# Patient Record
Sex: Male | Born: 1945 | Race: White | Hispanic: No | Marital: Married | Smoking: Never smoker
Health system: Southern US, Community
[De-identification: ages and names within clinical notes are randomized; demographics above are authoritative.]

## PROBLEM LIST (undated history)

## (undated) DIAGNOSIS — J302 Other seasonal allergic rhinitis: Secondary | ICD-10-CM

## (undated) DIAGNOSIS — H25019 Cortical age-related cataract, unspecified eye: Secondary | ICD-10-CM

## (undated) DIAGNOSIS — M1990 Unspecified osteoarthritis, unspecified site: Secondary | ICD-10-CM

## (undated) DIAGNOSIS — K649 Unspecified hemorrhoids: Secondary | ICD-10-CM

## (undated) DIAGNOSIS — R002 Palpitations: Secondary | ICD-10-CM

## (undated) DIAGNOSIS — F419 Anxiety disorder, unspecified: Secondary | ICD-10-CM

## (undated) HISTORY — PX: GUM SURGERY: SHX658

## (undated) HISTORY — PX: NECK SURGERY: SHX720

## (undated) HISTORY — PX: CATARACT EXTRACTION: SUR2

## (undated) HISTORY — PX: EYE SURGERY: SHX253

## (undated) HISTORY — PX: COLONOSCOPY: SHX174

---

## 2004-09-24 ENCOUNTER — Encounter: Payer: Self-pay | Admitting: Psychiatry

## 2004-09-28 ENCOUNTER — Encounter: Payer: Self-pay | Admitting: Psychiatry

## 2005-09-15 ENCOUNTER — Inpatient Hospital Stay: Payer: Self-pay | Admitting: Unknown Physician Specialty

## 2009-04-23 ENCOUNTER — Ambulatory Visit: Payer: Self-pay | Admitting: Ophthalmology

## 2010-07-17 ENCOUNTER — Ambulatory Visit: Payer: Self-pay | Admitting: Ophthalmology

## 2010-07-29 ENCOUNTER — Ambulatory Visit: Payer: Self-pay | Admitting: Ophthalmology

## 2012-06-07 ENCOUNTER — Ambulatory Visit: Payer: Self-pay | Admitting: Gastroenterology

## 2012-07-25 ENCOUNTER — Emergency Department: Payer: Self-pay | Admitting: Unknown Physician Specialty

## 2012-07-25 LAB — BASIC METABOLIC PANEL
Chloride: 105 mmol/L (ref 98–107)
Creatinine: 0.96 mg/dL (ref 0.60–1.30)
EGFR (Non-African Amer.): >60
Glucose: 105 mg/dL — ABNORMAL HIGH (ref 65–99)
Osmolality: 276 (ref 275–301)

## 2012-07-25 LAB — CBC
HGB: 13.9 g/dL (ref 13.0–18.0)
MCH: 32.8 pg (ref 26.0–34.0)
Platelet: 208 10*3/uL (ref 150–440)
RBC: 4.24 10*6/uL — ABNORMAL LOW (ref 4.40–5.90)
RDW: 12.8 % (ref 11.5–14.5)

## 2012-07-25 LAB — CK TOTAL AND CKMB (NOT AT ARMC): CK, Total: 341 U/L — ABNORMAL HIGH (ref 35–232)

## 2012-07-25 LAB — HEPATIC FUNCTION PANEL A (ARMC)
Alkaline Phosphatase: 93 U/L (ref 50–136)
Bilirubin, Direct: 0.1 mg/dL (ref 0.00–0.20)
Bilirubin,Total: 0.4 mg/dL (ref 0.2–1.0)
SGOT(AST): 45 U/L — ABNORMAL HIGH (ref 15–37)
SGPT (ALT): 33 U/L (ref 12–78)

## 2012-07-25 LAB — TROPONIN I
Troponin-I: <0.02 ng/mL
Troponin-I: <0.02 ng/mL

## 2014-06-04 IMAGING — CR DG CHEST 2V
1 series · 2 of 2 positions shown · non-contrast
Comparison: none

REASON FOR EXAM: cp
COMMENTS:   May transport without cardiac monitor

PROCEDURE:     DXR - DXR CHEST PA (OR AP) AND LATERAL  - July 25, 2012  [DATE]
RESULT:     The lungs are clear. The cardiac silhouette and visualized bony
skeleton are unremarkable.

[Series 1: w chest pa · 0.14mm/px · 2 of 2 slices shown]
[im 1/2]
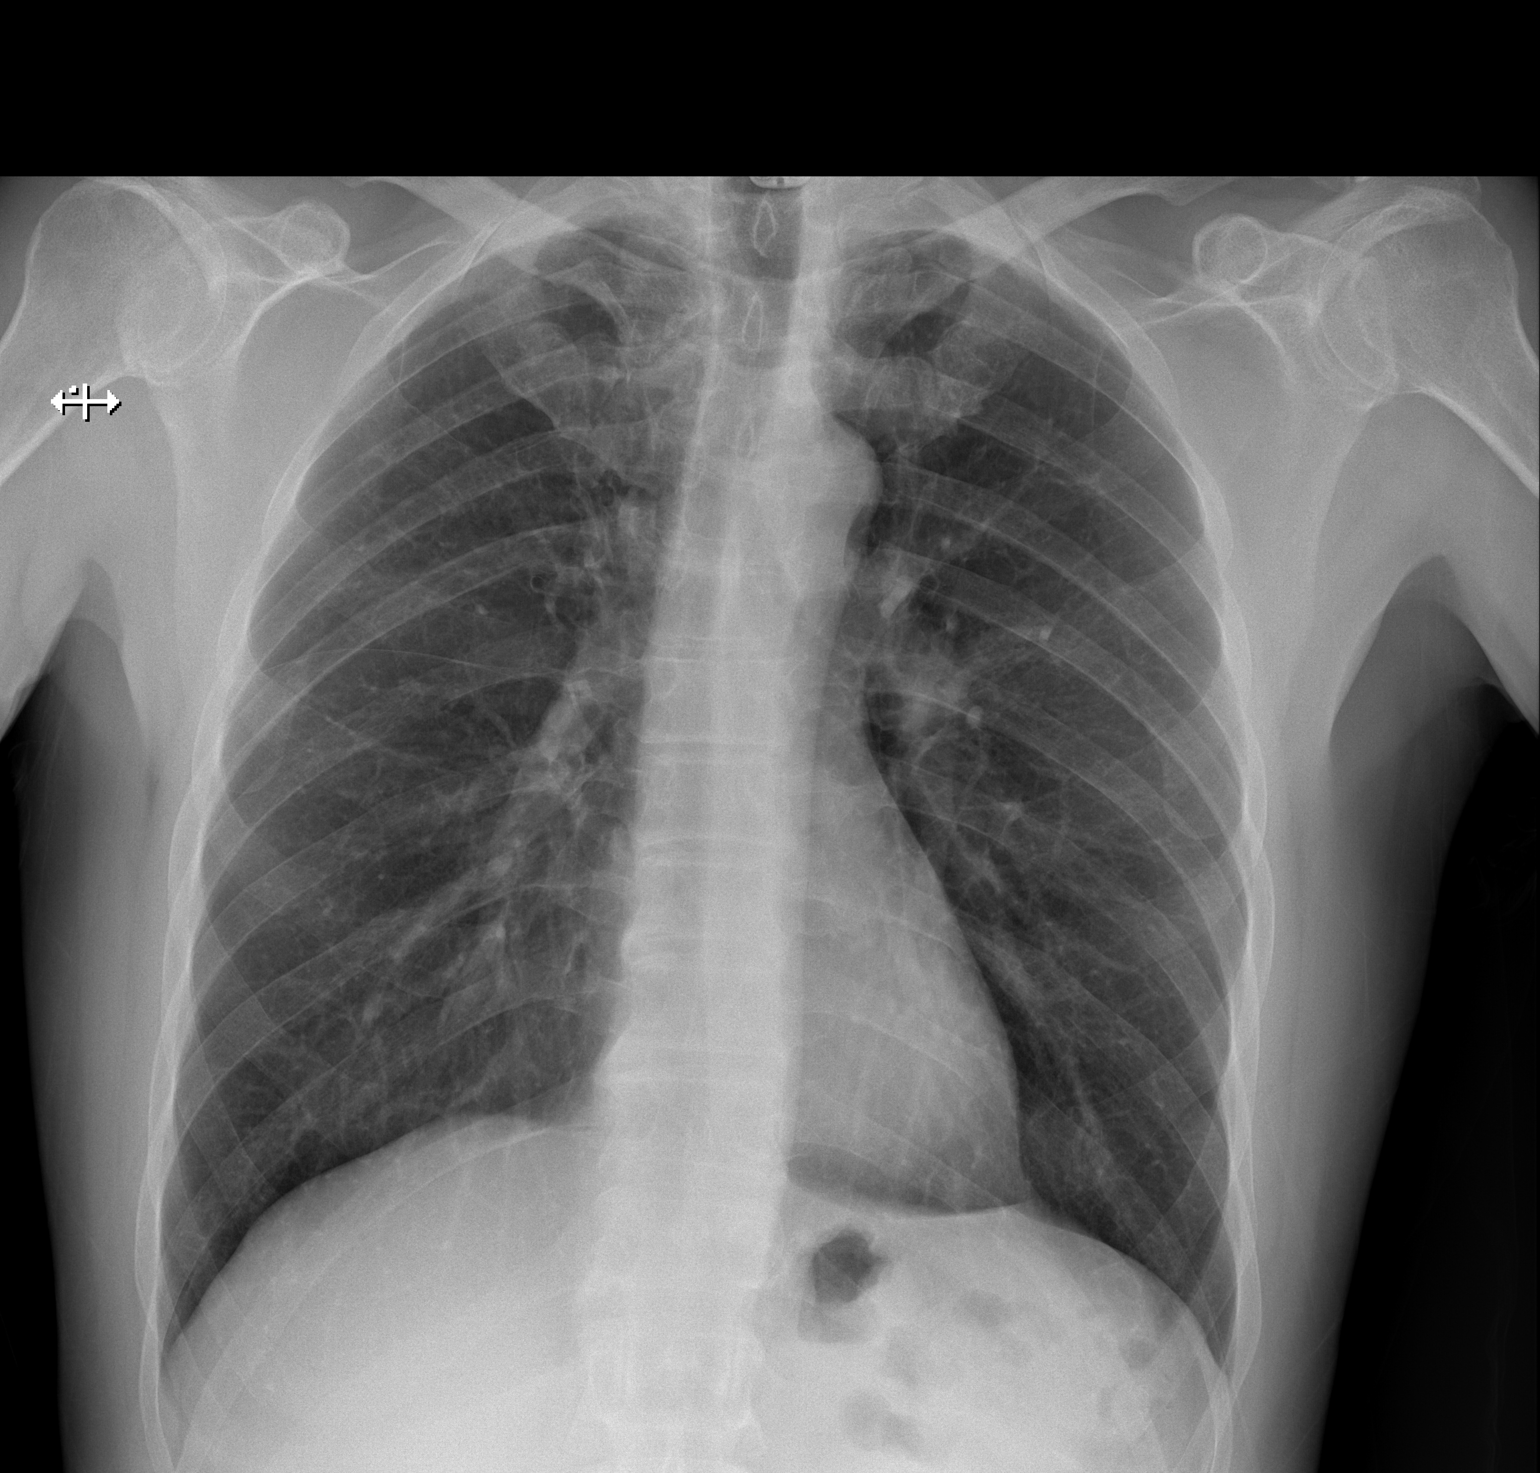
[im 2/2]
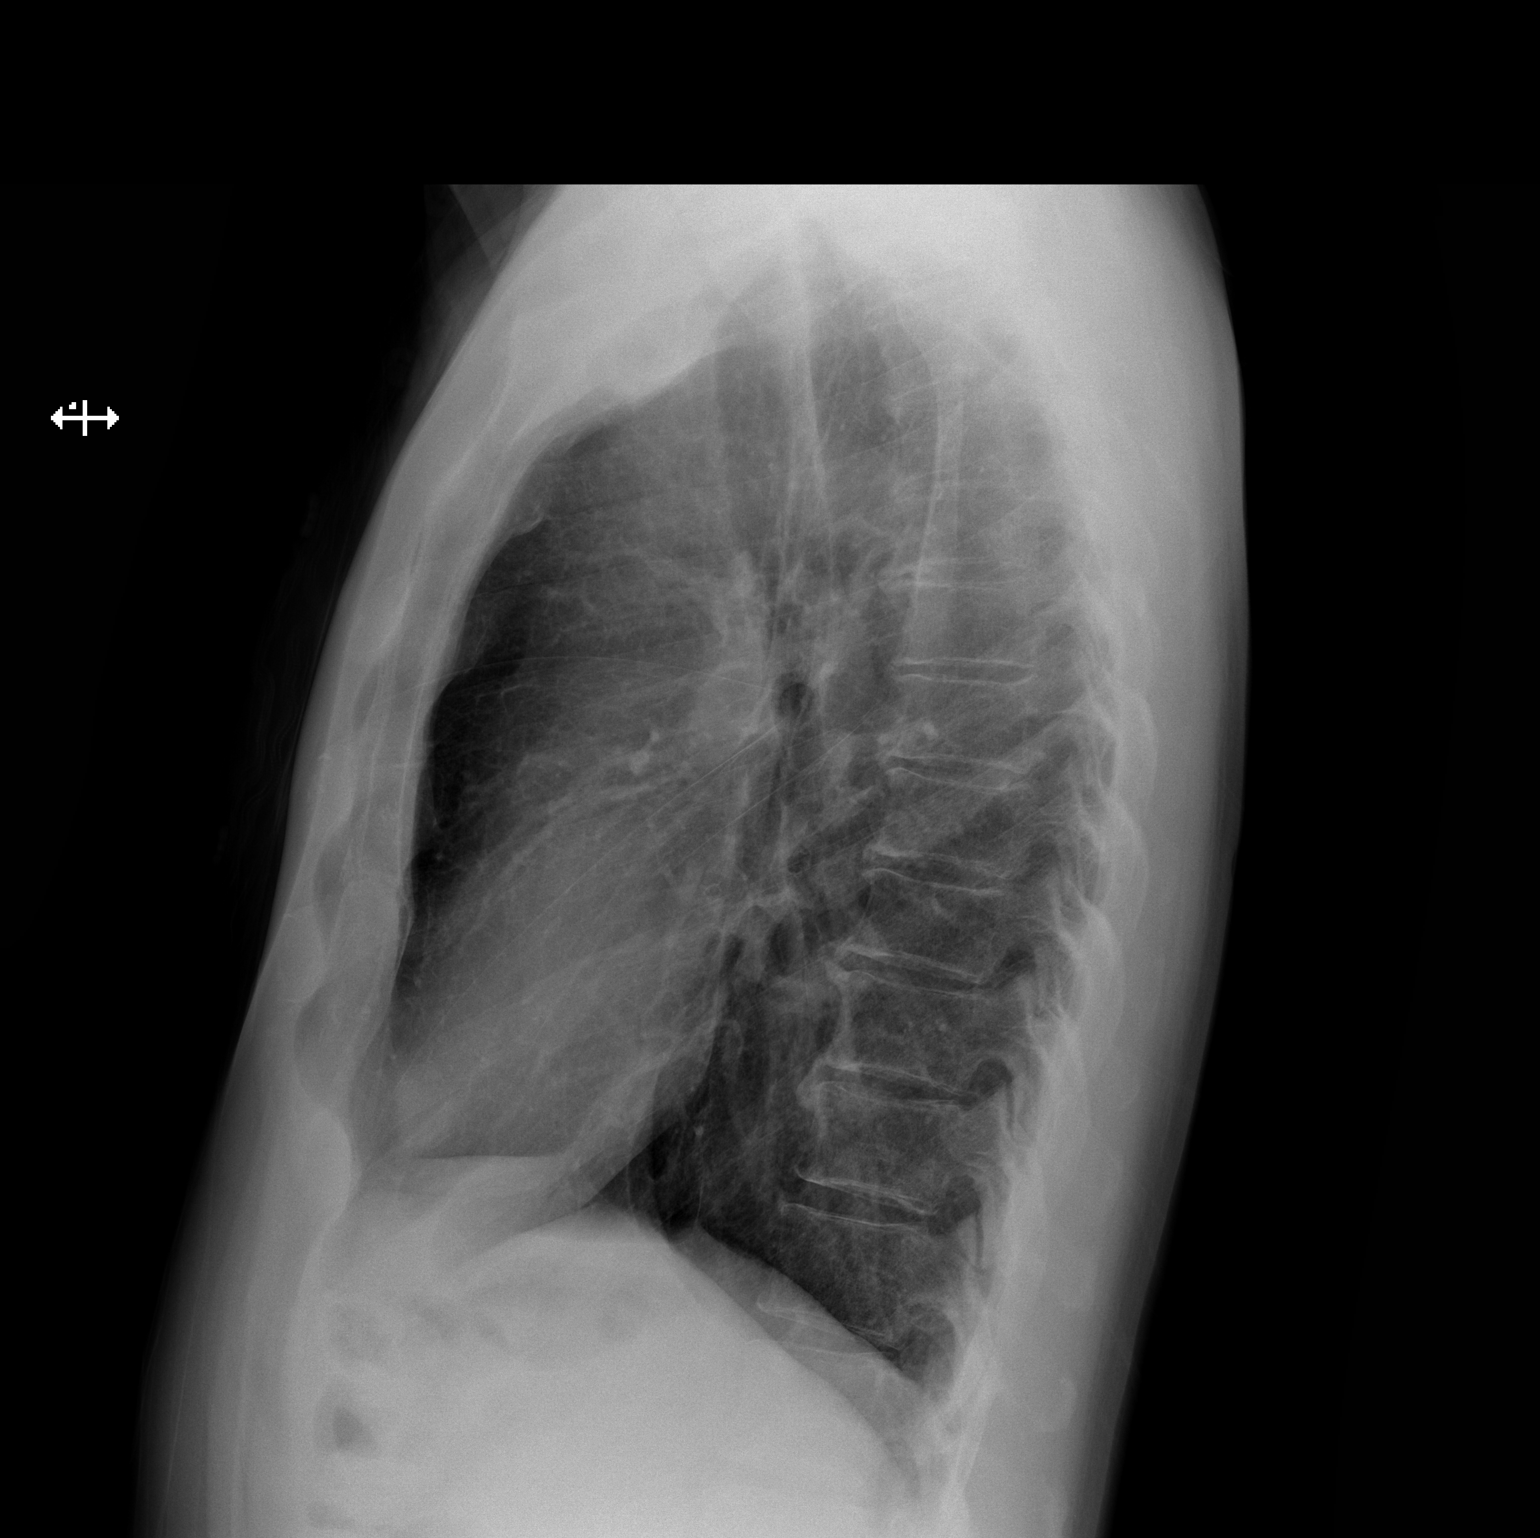

[2 of 2 positions shown; findings below may reference images not displayed]

IMPRESSION: 1. Chest radiograph without evidence of acute cardiopulmonary disease.

## 2018-03-22 ENCOUNTER — Encounter: Payer: Self-pay | Admitting: *Deleted

## 2018-03-23 ENCOUNTER — Ambulatory Visit
Admission: RE | Admit: 2018-03-23 | Discharge: 2018-03-23 | Disposition: A | Discharge status: Home / Self Care | Payer: Medicare HMO | Source: Ambulatory Visit | Attending: Gastroenterology | Admitting: Gastroenterology

## 2018-03-23 ENCOUNTER — Encounter
Admission: RE | Disposition: A | Discharge status: Home / Self Care | Payer: Self-pay | Source: Ambulatory Visit | Attending: Gastroenterology

## 2018-03-23 ENCOUNTER — Ambulatory Visit: Payer: Medicare HMO | Admitting: Anesthesiology

## 2018-03-23 ENCOUNTER — Encounter: Payer: Self-pay | Admitting: *Deleted

## 2018-03-23 DIAGNOSIS — F419 Anxiety disorder, unspecified: Secondary | ICD-10-CM | POA: Insufficient documentation

## 2018-03-23 DIAGNOSIS — Z8371 Family history of colonic polyps: Secondary | ICD-10-CM | POA: Diagnosis not present

## 2018-03-23 DIAGNOSIS — Z86010 Personal history of colonic polyps: Secondary | ICD-10-CM | POA: Diagnosis not present

## 2018-03-23 DIAGNOSIS — R002 Palpitations: Secondary | ICD-10-CM | POA: Diagnosis not present

## 2018-03-23 DIAGNOSIS — K5730 Diverticulosis of large intestine without perforation or abscess without bleeding: Secondary | ICD-10-CM | POA: Insufficient documentation

## 2018-03-23 DIAGNOSIS — M1990 Unspecified osteoarthritis, unspecified site: Secondary | ICD-10-CM | POA: Diagnosis not present

## 2018-03-23 DIAGNOSIS — K640 First degree hemorrhoids: Secondary | ICD-10-CM | POA: Diagnosis not present

## 2018-03-23 DIAGNOSIS — Z888 Allergy status to other drugs, medicaments and biological substances status: Secondary | ICD-10-CM | POA: Diagnosis not present

## 2018-03-23 DIAGNOSIS — Z79899 Other long term (current) drug therapy: Secondary | ICD-10-CM | POA: Insufficient documentation

## 2018-03-23 DIAGNOSIS — Z1211 Encounter for screening for malignant neoplasm of colon: Secondary | ICD-10-CM | POA: Insufficient documentation

## 2018-03-23 HISTORY — DX: Unspecified hemorrhoids: K64.9

## 2018-03-23 HISTORY — PX: COLONOSCOPY WITH PROPOFOL: SHX5780

## 2018-03-23 HISTORY — DX: Palpitations: R00.2

## 2018-03-23 HISTORY — DX: Unspecified osteoarthritis, unspecified site: M19.90

## 2018-03-23 HISTORY — DX: Anxiety disorder, unspecified: F41.9

## 2018-03-23 HISTORY — DX: Cortical age-related cataract, unspecified eye: H25.019

## 2018-03-23 HISTORY — DX: Other seasonal allergic rhinitis: J30.2

## 2018-03-23 SURGERY — COLONOSCOPY WITH PROPOFOL
Anesthesia: General

## 2018-03-23 MED ORDER — SODIUM CHLORIDE 0.9 % IV SOLN
INTRAVENOUS | Status: DC | PRN
  Administered 2018-03-23: 1000 mL
  Administered 2018-03-23: 07:00:00 via INTRAVENOUS

## 2018-03-23 MED ORDER — EPHEDRINE SULFATE 50 MG/ML IJ SOLN
INTRAMUSCULAR | Status: DC | PRN
  Administered 2018-03-23: 10 mg via INTRAVENOUS

## 2018-03-23 MED ORDER — FENTANYL CITRATE (PF) 100 MCG/2ML IJ SOLN
INTRAMUSCULAR | Status: DC | PRN
  Administered 2018-03-23: 50 ug via INTRAVENOUS

## 2018-03-23 MED ORDER — LIDOCAINE HCL (PF) 2 % IJ SOLN
INTRAMUSCULAR | Status: AC
  Filled 2018-03-23: qty 10

## 2018-03-23 MED ORDER — PROPOFOL 500 MG/50ML IV EMUL
INTRAVENOUS | Status: AC
  Filled 2018-03-23: qty 50

## 2018-03-23 MED ORDER — PROPOFOL 10 MG/ML IV BOLUS
INTRAVENOUS | Status: AC
  Filled 2018-03-23: qty 20

## 2018-03-23 MED ORDER — PROPOFOL 500 MG/50ML IV EMUL
INTRAVENOUS | Status: DC | PRN
  Administered 2018-03-23: 150 ug/kg/min via INTRAVENOUS

## 2018-03-23 MED ORDER — PROPOFOL 10 MG/ML IV BOLUS
INTRAVENOUS | Status: DC | PRN
  Administered 2018-03-23: 100 mg via INTRAVENOUS

## 2018-03-23 MED ORDER — SODIUM CHLORIDE 0.9 % IV SOLN: INTRAVENOUS | Status: DC

## 2018-03-23 MED ORDER — FENTANYL CITRATE (PF) 100 MCG/2ML IJ SOLN
INTRAMUSCULAR | Status: AC
  Filled 2018-03-23: qty 2

## 2018-03-23 MED ORDER — LIDOCAINE 2% (20 MG/ML) 5 ML SYRINGE
INTRAMUSCULAR | Status: DC | PRN
  Administered 2018-03-23: 30 mg via INTRAVENOUS

## 2018-03-23 NOTE — Transfer of Care (Signed)
Immediate Anesthesia Transfer of Care Note  Patient: Luis Gibson.  Procedure(s) Performed: COLONOSCOPY WITH PROPOFOL (N/A )  Patient Location: PACU and Endoscopy Unit  Anesthesia Type:General  Level of Consciousness: sedated and drowsy  Airway & Oxygen Therapy: Patient Spontanous Breathing and Patient connected to nasal cannula oxygen  Post-op Assessment: Report given to RN and Post -op Vital signs reviewed and stable  Post vital signs: Reviewed and stable  Last Vitals:  Vitals Value Taken Time  BP    Temp 36 C 03/23/2018  8:07 AM  Pulse    Resp 16 03/23/2018  8:07 AM  SpO2      Last Pain:  Vitals:   03/23/18 0807  TempSrc: Tympanic         Complications: No apparent anesthesia complications

## 2018-03-23 NOTE — Op Note (Signed)
[facility] Gastroenterology Patient Name: Luis Gibson Procedure Date: 03/23/2018 7:26 AM MRN: [id number] Account #: [id number] Date of Birth: May 28, 1946 Admit Type: Outpatient Age: 72 Room: [facility] ENDO ROOM 3 Gender: Male Note Status: Finalized Procedure:            Colonoscopy Indications:          Family history of colonic polyps in a first-degree                        relative Providers:            Lollie Sails, MD Referring MD:         Tracie Harrier, MD (Referring MD) Medicines:            Monitored Anesthesia Care Complications:        No immediate complications. Procedure:            Pre-Anesthesia Assessment:                       - ASA Grade Assessment: II - A patient with mild                        systemic disease.                       After obtaining informed consent, the colonoscope was                        passed under direct vision. Throughout the procedure,                        the patient's blood pressure, pulse, and oxygen                        saturations were monitored continuously. The was                        introduced through the anus and advanced to the the                        cecum, identified by appendiceal orifice and ileocecal                        valve. The colonoscopy was performed without                        difficulty. The patient tolerated the procedure well.                        The quality of the bowel preparation was good. Findings:      A few medium-mouthed diverticula were found in the sigmoid colon and       distal descending colon.      Non-bleeding internal hemorrhoids were found during retroflexion. The       hemorrhoids were small and Grade I (internal hemorrhoids that do not       prolapse).      The retroflexed view of the distal rectum and anal verge was normal and       showed no anal or rectal abnormalities.      The digital rectal exam was normal.  The terminal ileum appeared  normal. Impression:           - Diverticulosis in the sigmoid colon and in the distal                        descending colon.                       - Non-bleeding internal hemorrhoids.                       - The distal rectum and anal verge are normal on                        retroflexion view.                       - No specimens collected. Recommendation:       - Discharge patient to home.                       - Advance diet as tolerated.                       - Repeat colonoscopy in 5 years for screening purposes. Procedure Code(s):    --- Professional ---                       [phone], Colonoscopy, flexible; diagnostic, including                        collection of specimen(s) by brushing or washing, when                        performed (separate procedure) Diagnosis Code(s):    --- Professional ---                       K64.0, First degree hemorrhoids                       Z83.71, Family history of colonic polyps                       K57.30, Diverticulosis of large intestine without                        perforation or abscess without bleeding CPT copyright 2017 American Medical Association. All rights reserved. The codes documented in this report are preliminary and upon coder review may  be revised to meet current compliance requirements. Lollie Sails, MD 03/23/2018 8:07:09 AM This report has been signed electronically. Number of Addenda: 0 Note Initiated On: 03/23/2018 7:26 AM Scope Withdrawal Time: 0 hours 10 minutes 38 seconds  Total Procedure Duration: 0 hours 19 minutes 56 seconds       [facility]

## 2018-03-23 NOTE — Anesthesia Post-op Follow-up Note (Signed)
Anesthesia QCDR form completed.        

## 2018-03-23 NOTE — H&P (Signed)
Outpatient short stay form Pre-procedure 03/23/2018 7:14 AM Luis Sails MD  Primary Physician: Dr Tracie Harrier  Reason for visit: Colonoscopy  History of present illness: Patient is a 72 year old male presenting today as above.  His last colonoscopy was 06/07/2012- for polyps at that time.  However there is a family history of colon polyps in a primary relative, father.  Tolerated his prep well.  He takes no aspirin or blood thinning agent.   No current facility-administered medications for this encounter.   Facility-Administered Medications Ordered in Other Encounters:  .  0.9 %  sodium chloride infusion, , , Continuous PRN, Courtney Paris, CRNA  Medications Prior to Admission  Medication Sig Dispense Refill Last Dose  . cetirizine (ZYRTEC) 10 MG tablet Take 10 mg by mouth daily.     Marland Kitchen gabapentin (NEURONTIN) 100 MG capsule Take 100 mg by mouth 3 (three) times daily.   Past Week at Unknown time  . magnesium oxide (MAG-OX) 400 MG tablet Take 400 mg by mouth daily.   Past Week at Unknown time  . Multiple Vitamin (MULTIVITAMIN) tablet Take 1 tablet by mouth daily.   Past Week at Unknown time  . naproxen (NAPROSYN) 500 MG tablet Take 500 mg by mouth 2 (two) times daily with a meal.   Past Week at Unknown time  . propranolol (INDERAL) 10 MG tablet Take 10 mg by mouth 3 (three) times daily.   03/22/2018 at Unknown time  . psyllium (METAMUCIL) 58.6 % powder Take 1 packet by mouth 3 (three) times daily.   Past Week at Unknown time  . sertraline (ZOLOFT) 50 MG tablet Take 50 mg by mouth daily.   Past Week at Unknown time  . triamcinolone (KENALOG) 0.1 % paste Use as directed 1 application in the mouth or throat 2 (two) times daily.   Past Month at Unknown time  . fluticasone (FLONASE) 50 MCG/ACT nasal spray Place into both nostrils daily.   Not Taking at Unknown time     Allergies  Allergen Reactions  . Atrovent [Ipratropium]      Past Medical History:  Diagnosis Date  . Anxiety    . Arthritis   . Cataract cortical, senile   . Hemorrhoids   . Palpitations   . Seasonal allergies     Review of systems:      Physical Exam    Heart and lungs: Regular rate and rhythm without rub or gallop, lungs are bilaterally clear.    HEENT: Normocephalic atraumatic eyes are anicteric    Other:    Pertinant exam for procedure: Soft nontender nondistended bowel sounds positive normoactive.    Planned proceedures: Colonoscopy and indicated procedures. I have discussed the risks benefits and complications of procedures to include not limited to bleeding, infection, perforation and the risk of sedation and the patient wishes to proceed.    Luis Sails, MD Gastroenterology 03/23/2018  7:14 AM

## 2018-03-23 NOTE — Anesthesia Preprocedure Evaluation (Addendum)
Anesthesia Evaluation  Patient identified by MRN, date of birth, ID band Patient awake    Reviewed: Allergy & Precautions, H&P , NPO status , Patient's Chart, lab work & pertinent test results  Airway Mallampati: III  TM Distance: <3 FB Neck ROM: limited   Comment: Prior c-spine surgeries, limited neck extension Dental  (+) Teeth Intact   Pulmonary neg pulmonary ROS, neg sleep apnea, neg COPD,    breath sounds clear to auscultation       Cardiovascular (-) angina(-) Cardiac Stents and (-) CABG negative cardio ROS  + dysrhythmias (palpitations, resolved)  Rhythm:regular Rate:Normal     Neuro/Psych Anxiety negative neurological ROS  negative psych ROS   GI/Hepatic negative GI ROS, Neg liver ROS,   Endo/Other  negative endocrine ROS  Renal/GU negative Renal ROS  negative genitourinary   Musculoskeletal  (+) Arthritis ,   Abdominal   Peds  Hematology negative hematology ROS (+)   Anesthesia Other Findings Past Medical History: No date: Anxiety No date: Arthritis No date: Cataract cortical, senile No date: Hemorrhoids No date: Palpitations No date: Seasonal allergies  Past Surgical History: No date: CATARACT EXTRACTION No date: COLONOSCOPY No date: EYE SURGERY No date: GUM SURGERY No date: NECK SURGERY  BMI    Body Mass Index:  24.39 kg/m      Reproductive/Obstetrics negative OB ROS                            Anesthesia Physical Anesthesia Plan  ASA: II  Anesthesia Plan: General   Post-op Pain Management:    Induction:   PONV Risk Score and Plan: Propofol infusion and TIVA  Airway Management Planned: Nasal Cannula and Natural Airway  Additional Equipment:   Intra-op Plan:   Post-operative Plan:   Informed Consent: I have reviewed the patients History and Physical, chart, labs and discussed the procedure including the risks, benefits and alternatives for the  proposed anesthesia with the patient or authorized representative who has indicated his/her understanding and acceptance.   Dental Advisory Given  Plan Discussed with: Anesthesiologist, CRNA and Surgeon  Anesthesia Plan Comments:        Anesthesia Quick Evaluation

## 2018-03-23 NOTE — Anesthesia Postprocedure Evaluation (Signed)
Anesthesia Post Note  Patient: Luis Gibson [facility].  Procedure(s) Performed: COLONOSCOPY WITH PROPOFOL (N/A )  Patient location during evaluation: PACU Anesthesia Type: General Level of consciousness: awake and alert Pain management: pain level controlled Vital Signs Assessment: post-procedure vital signs reviewed and stable Respiratory status: spontaneous breathing, nonlabored ventilation, respiratory function stable and patient connected to nasal cannula oxygen Cardiovascular status: blood pressure returned to baseline and stable Postop Assessment: no apparent nausea or vomiting Anesthetic complications: no     Last Vitals:  Vitals:   03/23/18 0817 03/23/18 0827  BP: 110/72 108/65  Pulse:    Resp:    Temp:    SpO2:      Last Pain:  Vitals:   03/23/18 0837  TempSrc:   PainSc: 0-No pain                 Durenda Hurt

## 2018-03-25 ENCOUNTER — Encounter: Payer: Self-pay | Admitting: Gastroenterology

## 2018-12-14 DIAGNOSIS — C4491 Basal cell carcinoma of skin, unspecified: Secondary | ICD-10-CM

## 2018-12-14 HISTORY — DX: Basal cell carcinoma of skin, unspecified: C44.91

## 2020-09-11 ENCOUNTER — Ambulatory Visit: Payer: Medicare HMO | Admitting: Dermatology

## 2020-09-11 ENCOUNTER — Encounter: Payer: Self-pay | Admitting: Dermatology

## 2020-09-11 ENCOUNTER — Other Ambulatory Visit: Payer: Self-pay

## 2020-09-11 DIAGNOSIS — Z85828 Personal history of other malignant neoplasm of skin: Secondary | ICD-10-CM | POA: Diagnosis not present

## 2020-09-11 DIAGNOSIS — L814 Other melanin hyperpigmentation: Secondary | ICD-10-CM

## 2020-09-11 DIAGNOSIS — D1800 Hemangioma unspecified site: Secondary | ICD-10-CM

## 2020-09-11 DIAGNOSIS — L570 Actinic keratosis: Secondary | ICD-10-CM | POA: Diagnosis not present

## 2020-09-11 DIAGNOSIS — D229 Melanocytic nevi, unspecified: Secondary | ICD-10-CM

## 2020-09-11 DIAGNOSIS — L578 Other skin changes due to chronic exposure to nonionizing radiation: Secondary | ICD-10-CM

## 2020-09-11 DIAGNOSIS — L719 Rosacea, unspecified: Secondary | ICD-10-CM

## 2020-09-11 DIAGNOSIS — R238 Other skin changes: Secondary | ICD-10-CM | POA: Diagnosis not present

## 2020-09-11 DIAGNOSIS — L821 Other seborrheic keratosis: Secondary | ICD-10-CM

## 2020-09-11 DIAGNOSIS — D170 Benign lipomatous neoplasm of skin and subcutaneous tissue of head, face and neck: Secondary | ICD-10-CM

## 2020-09-11 DIAGNOSIS — Z1283 Encounter for screening for malignant neoplasm of skin: Secondary | ICD-10-CM

## 2020-09-11 DIAGNOSIS — D2262 Melanocytic nevi of left upper limb, including shoulder: Secondary | ICD-10-CM

## 2020-09-11 MED ORDER — METRONIDAZOLE 0.75 % EX GEL: 1.0000 "application " | Freq: Every day | CUTANEOUS | 6 refills | Status: AC

## 2020-09-11 NOTE — Progress Notes (Signed)
Follow-Up Visit   Subjective  Luis Gibson. is a 75 y.o. male who presents for the following: Upper body skin exam (Hx of BCC, AKs) and check spots (Chest, no symptoms).   The following portions of the chart were reviewed this encounter and updated as appropriate:       Review of Systems:  No other skin or systemic complaints except as noted in HPI or Assessment and Plan.  Objective  Well appearing patient in no apparent distress; mood and affect are within normal limits.  All skin waist up examined.  Objective  Right medial lower calf: Well healed scar with no evidence of recurrence.   Objective  posterior neck at hairline: Blanching blue macule  Left Forearm  Objective  Left Forearm: 2.24mm med dark brown macule, no changes per pt  Objective  face: Erythema nose, inflammatory pap L nasal tip  Objective  vertex scalp x 1: Pink scaly macule   Objective  R forehead above eyebrow: 2.0cm rubbery nodule   Assessment & Plan    Lentigines - Scattered tan macules - Due to sun exposure - Benign-appering, observe - Recommend daily broad spectrum sunscreen SPF 30+ to sun-exposed areas, reapply every 2 hours as needed. - Call for any changes - back  Seborrheic Keratoses - Stuck-on, waxy, tan-brown papules and plaques  - Discussed benign etiology and prognosis. - Observe - Call for any changes - abdomen, chest, back, arms  Melanocytic Nevi - Tan-brown and/or pink-flesh-colored symmetric macules and papules - Benign appearing on exam today - Observation - Call clinic for new or changing moles - Recommend daily use of broad spectrum spf 30+ sunscreen to sun-exposed areas.  - trunk, chest, face  Hemangiomas - Red papules - Discussed benign nature - Observe - Call for any changes  Actinic Damage - Chronic, secondary to cumulative UV/sun exposure - diffuse scaly erythematous macules with underlying dyspigmentation - Recommend daily broad spectrum  sunscreen SPF 30+ to sun-exposed areas, reapply every 2 hours as needed.  - Call for new or changing lesions.  Skin cancer screening performed today.   History of basal cell carcinoma (BCC) Right medial lower calf  Clear. Observe for recurrence. Call clinic for new or changing lesions.  Recommend regular skin exams, daily broad-spectrum spf 30+ sunscreen use, and photoprotection.     Venous lake posterior neck at hairline  Benign, observe  Nevus Left Forearm  Benign-appearing.  Observation.  Call clinic for new or changing moles.  Recommend daily use of broad spectrum spf 30+ sunscreen to sun-exposed areas.    Rosacea face  With flare Rosacea is a chronic progressive skin condition usually affecting the face of adults, causing redness and/or acne bumps. It is treatable but not curable. It sometimes affects the eyes (ocular rosacea) as well. It may respond to topical and/or systemic medication and can flare with stress, sun exposure, alcohol, exercise and some foods.  Daily application of broad spectrum spf 30+ sunscreen to face is recommended to reduce flares.   Start Metronidazole 0.75% gel qhs to face  metroNIDAZOLE (METROGEL) 0.75 % gel - face  AK (actinic keratosis) vertex scalp x 1  Destruction of lesion - vertex scalp x 1  Destruction method: cryotherapy   Informed consent: discussed and consent obtained   Lesion destroyed using liquid nitrogen: Yes   Region frozen until ice ball extended beyond lesion: Yes   Outcome: patient tolerated procedure well with no complications   Post-procedure details: wound care instructions given  Lipoma of face R forehead above eyebrow  Benign appearing, discussed having ENT doctor excise if bothersome  Return in about 1 year (around 09/11/2021) for TBSE, Hx of BCC, Hx of AKs, rosacea.  I, Othelia Pulling, RMA, am acting as scribe for Brendolyn Patty, MD .  Documentation: I have reviewed the above documentation for accuracy and  completeness, and I agree with the above.  Brendolyn Patty MD

## 2020-09-11 NOTE — Patient Instructions (Addendum)
If you have any questions or concerns for your doctor, please call our main line at [phone] and press option 4 to reach your doctor's medical assistant. If no one answers, please leave a voicemail as directed and we will return your call as soon as possible. Messages left after 4 pm will be answered the following business day.   You may also send [place] a message via [place]. We typically respond to MyChart messages within 1-2 business days.  For prescription refills, please ask your pharmacy to contact our office. Our fax number is [phone].  If you have an urgent issue when the clinic is closed that cannot wait until the next business day, you can page your doctor at the number below.    Please note that while we do our best to be available for urgent issues outside of office hours, we are not available 24/7.   If you have an urgent issue and are unable to reach [place], you may choose to seek medical care at your doctor's office, retail clinic, urgent care center, or emergency room.  If you have a medical emergency, please immediately call 911 or go to the emergency department.  Pager Numbers  - Dr. Nehemiah Massed: [phone]  - Dr. Laurence Ferrari: [phone]  - Dr. Nicole Kindred: [phone]  In the event of inclement weather, please call our main line at [phone] for an update on the status of any delays or closures.  Dermatology Medication Tips: Please keep the boxes that topical medications come in in order to help keep track of the instructions about where and how to use these. Pharmacies typically print the medication instructions only on the boxes and not directly on the medication tubes.   If your medication is too expensive, please contact our office at [phone] option 4 or send [place] a message through [place].   We are unable to tell what your co-pay for medications will be in advance as this is different depending on your insurance coverage. However, we may be able to find a substitute  medication at lower cost or fill out paperwork to get insurance to cover a needed medication.   If a prior authorization is required to get your medication covered by your insurance company, please allow [place] 1-2 business days to complete this process.  Drug prices often vary depending on where the prescription is filled and some pharmacies may offer cheaper prices.  The website www.goodrx.com contains coupons for medications through different pharmacies. The prices here do not account for what the cost may be with help from insurance (it may be cheaper with your insurance), but the website can give you the price if you did not use any insurance.  - You can print the associated coupon and take it with your prescription to the pharmacy.  - You may also stop by our office during regular business hours and pick up a GoodRx coupon card.  - If you need your prescription sent electronically to a different pharmacy, notify our office through [facility] or by phone at [phone] option 4.   Seborrheic Keratosis  What causes seborrheic keratoses? Seborrheic keratoses are harmless, common skin growths that first appear during adult life.  As time goes by, more growths appear.  Some people may develop a large number of them.  Seborrheic keratoses appear on both covered and uncovered body parts.  They are not caused by sunlight.  The tendency to develop seborrheic keratoses can be inherited.  They vary in color from skin-colored to  gray, brown, or even black.  They can be either smooth or have a rough, warty surface.   Seborrheic keratoses are superficial and look as if they were stuck on the skin.  Under the microscope this type of keratosis looks like layers upon layers of skin.  That is why at times the top layer may seem to fall off, but the rest of the growth remains and re-grows.    Treatment Seborrheic keratoses do not need to be treated, but can easily be removed in the office.  Seborrheic  keratoses often cause symptoms when they rub on clothing or jewelry.  Lesions can be in the way of shaving.  If they become inflamed, they can cause itching, soreness, or burning.  Removal of a seborrheic keratosis can be accomplished by freezing, burning, or surgery. If any spot bleeds, scabs, or grows rapidly, please return to have it checked, as these can be an indication of a skin cancer.

## 2021-09-17 ENCOUNTER — Ambulatory Visit: Payer: Medicare HMO | Admitting: Dermatology

## 2021-09-17 ENCOUNTER — Other Ambulatory Visit: Payer: Self-pay

## 2021-09-17 DIAGNOSIS — L853 Xerosis cutis: Secondary | ICD-10-CM | POA: Diagnosis not present

## 2021-09-17 DIAGNOSIS — L821 Other seborrheic keratosis: Secondary | ICD-10-CM

## 2021-09-17 DIAGNOSIS — L570 Actinic keratosis: Secondary | ICD-10-CM | POA: Diagnosis not present

## 2021-09-17 DIAGNOSIS — D229 Melanocytic nevi, unspecified: Secondary | ICD-10-CM

## 2021-09-17 DIAGNOSIS — D170 Benign lipomatous neoplasm of skin and subcutaneous tissue of head, face and neck: Secondary | ICD-10-CM

## 2021-09-17 DIAGNOSIS — Z85828 Personal history of other malignant neoplasm of skin: Secondary | ICD-10-CM

## 2021-09-17 DIAGNOSIS — D2262 Melanocytic nevi of left upper limb, including shoulder: Secondary | ICD-10-CM

## 2021-09-17 DIAGNOSIS — L578 Other skin changes due to chronic exposure to nonionizing radiation: Secondary | ICD-10-CM

## 2021-09-17 DIAGNOSIS — R238 Other skin changes: Secondary | ICD-10-CM

## 2021-09-17 DIAGNOSIS — L820 Inflamed seborrheic keratosis: Secondary | ICD-10-CM

## 2021-09-17 DIAGNOSIS — Z1283 Encounter for screening for malignant neoplasm of skin: Secondary | ICD-10-CM

## 2021-09-17 DIAGNOSIS — L814 Other melanin hyperpigmentation: Secondary | ICD-10-CM

## 2021-09-17 DIAGNOSIS — D1800 Hemangioma unspecified site: Secondary | ICD-10-CM

## 2021-09-17 DIAGNOSIS — L719 Rosacea, unspecified: Secondary | ICD-10-CM

## 2021-09-17 MED ORDER — CLOBETASOL PROPIONATE 0.05 % EX SOLN: 1.0000 "application " | Freq: Two times a day (BID) | CUTANEOUS | 1 refills | Status: AC

## 2021-09-17 NOTE — Progress Notes (Signed)
? ?Follow-Up Visit ?  ?Subjective  ?Luis Gibson. is a 76 y.o. male who presents for the following: Follow-up (Patient here today for 1 year tbse. Patient reports itchy spots at back, a spot at left dorsal hand, and possible wart at right hand. Patient has history of bcc, venous lake, nevus, rosacea and lipoma. ). ? ?The patient presents for Total-Body Skin Exam (TBSE) for skin cancer screening and mole check.  The patient has spots, moles and lesions to be evaluated, some may be new or changing and the patient has concerns that these could be cancer. ? ? ? ?The following portions of the chart were reviewed this encounter and updated as appropriate:   ?  ? ?Review of Systems: No other skin or systemic complaints except as noted in HPI or Assessment and Plan. ? ? ?Objective  ?Well appearing patient in no apparent distress; mood and affect are within normal limits. ? ?A full examination was performed including scalp, head, eyes, ears, nose, lips, neck, chest, axillae, abdomen, back, buttocks, bilateral upper extremities, bilateral lower extremities, hands, feet, fingers, toes, fingernails, and toenails. All findings within normal limits unless otherwise noted below. ? ?back ?Xerosis  ? ?Head - Anterior (Face) ?Mid face erythema ? ?right forehead above eyebrow ?2.5  cm rubbery nodule ? ?left forearm ?2.90m med dark brown macule, no changes per pt ? ?Left Dorsal Hand ?Light pink firm papule  ? ?posterior neck at hairline ?Blanching blue macule ? ?left upper forehead x 5 (5) ?Erythematous thin papules/macules with gritty scale.  ? ? 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 ?With pruritus vrs ISKs, chronic and persistent ? ? ?Recommend mild soap and moisturizing cream 1-2 times daily.  Gentle skin care handout provided.  ?If not improving with treatment, consider cryotherapy of ISKs ? ?Eczema Skin Care ? ?Buy TWO 16oz jars of CeraVe moisturizing cream ? [facility], [facility], [facility] (no prescription needed) ? Costs about  $15 per jar  ? ?Jar #1: Use as a moisturizer as needed. Can be applied to any area of the body. Use twice daily to unaffected areas. ? ?Jar #2: Pour one 555mbottle of clobetasol 0.05% solution into jar, mix well. Label this jar to indicate the medication has been added. Use twice daily to affected areas. Do not apply to face, groin or underarms. ? ?Moisturizer may burn or sting initially. Try for at least 4 weeks.  ? ? ?clobetasol (TEMOVATE) 0.05 % external solution - back ?Apply 1 application. topically 2 (two) times daily. Use as directed in handout. Use at back and legs Avoid applying to face, groin, and axilla. Use as directed. ? ?Rosacea ?Head - Anterior (Face) ? ?Mild, not bothersome to pt ? ?Rosacea is a chronic progressive skin condition usually affecting the face of adults, causing redness and/or acne bumps. It is treatable but not curable. It sometimes affects the eyes (ocular rosacea) as well. It may respond to topical and/or systemic medication and can flare with stress, sun exposure, alcohol, exercise and some foods.  Daily application of broad spectrum spf 30+ sunscreen to face is recommended to reduce flares ? ?Patient defers Rx treatment  ? ? ?Lipoma of face ?right forehead above eyebrow ? ?Benign, observe.   ? ?Nevus ?left forearm ? ?Vs SK  ? ?Benign-appearing.  Observation.  Call clinic for new or changing lesions.  Recommend daily use of broad spectrum spf 30+ sunscreen to sun-exposed areas.  ? ? ?Inflamed seborrheic keratosis ?Left Dorsal Hand ? ?Vs prurigo  nodule ? ?Avoid picking lesion ? ? ? ?Destruction of lesion - Left Dorsal Hand ? ?Destruction method: cryotherapy   ?Informed consent: discussed and consent obtained   ?Lesion destroyed using liquid nitrogen: Yes   ?Region frozen until ice ball extended beyond lesion: Yes   ?Outcome: patient tolerated procedure well with no complications   ?Post-procedure details: wound care instructions given   ?Additional details:  Prior to procedure,  discussed risks of blister formation, small wound, skin dyspigmentation, or rare scar following cryotherapy. Recommend Vaseline ointment to treated areas while healing. ? ? 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 ?Benign, observe.  ? ? ?Actinic keratosis (5) ?left upper forehead x 5 ? ?Actinic keratoses are precancerous spots that appear secondary to cumulative UV radiation exposure/sun exposure over time. They are chronic with expected duration over 1 year. A portion of actinic keratoses will progress to squamous cell carcinoma of the skin. It is not possible to reliably predict which spots will progress to skin cancer and so treatment is recommended to prevent development of skin cancer. ? ?Recommend daily broad spectrum sunscreen SPF 30+ to sun-exposed areas, reapply every 2 hours as needed.  ?Recommend staying in the shade or wearing long sleeves, sun glasses (UVA+UVB protection) and wide brim hats (4-inch brim around the entire circumference of the hat). ?Call for new or changing lesions. ? ?Destruction of lesion - left upper forehead x 5 ? ?Destruction method: cryotherapy   ?Informed consent: discussed and consent obtained   ?Lesion destroyed using liquid nitrogen: Yes   ?Region frozen until ice ball extended beyond lesion: Yes   ?Outcome: patient tolerated procedure well with no complications   ?Post-procedure details: wound care instructions given   ?Additional details:  Prior to procedure, discussed risks of blister formation, small wound, skin dyspigmentation, or rare scar following cryotherapy. Recommend Vaseline ointment to treated areas while healing. ? ? ?Lentigines ?- Scattered tan macules ?- Due to sun exposure ?- Benign-appearing, observe ?- Recommend daily broad spectrum sunscreen SPF 30+ to sun-exposed areas, reapply every 2 hours as needed. ?- Call for any changes ? ?Seborrheic Keratoses ?- Stuck-on, waxy, tan-brown papules and/or plaques at back  ?- Benign-appearing ?- Discussed benign  etiology and prognosis. ?- Observe ?- Call for any changes ? ?Melanocytic Nevi ?- Tan-brown and/or pink-flesh-colored symmetric macules and papules ?- Benign appearing on exam today ?- Observation ?- Call clinic for new or changing moles ?- Recommend daily use of broad spectrum spf 30+ sunscreen to sun-exposed areas.  ? ?Hemangiomas ?- Red papules ?- Discussed benign nature ?- Observe ?- Call for any changes ? ?Actinic Damage ?- Chronic condition, secondary to cumulative UV/sun exposure ?- diffuse scaly erythematous macules with underlying dyspigmentation ?- Recommend daily broad spectrum sunscreen SPF 30+ to sun-exposed areas, reapply every 2 hours as needed.  ?- Staying in the shade or wearing long sleeves, sun glasses (UVA+UVB protection) and wide brim hats (4-inch brim around the entire circumference of the hat) are also recommended for sun protection.  ?- Call for new or changing lesions. ? ?History of Basal Cell Carcinoma of the Skin ?- No evidence of recurrence today right lower medial calf (2020) ?- Recommend regular full body skin exams ?- Recommend daily broad spectrum sunscreen SPF 30+ to sun-exposed areas, reapply every 2 hours as needed.  ?- Call if any new or changing lesions are noted between office visits ? ?Skin cancer screening performed today. ?Return for 1 year tbse . ?I, Ruthell Rummage, CMA, am acting as scribe for  Brendolyn Patty, MD. ? ?Documentation: I have reviewed the above documentation for accuracy and completeness, and I agree with the above. ? ?Brendolyn Patty MD  ? ?

## 2021-09-17 NOTE — Patient Instructions (Addendum)
Eczema Skin Care ?Can use at back and legs  ? ?Buy TWO 16oz jars of CeraVe moisturizing cream ? [facility], [facility], [facility] (no prescription needed) ? Costs about $15 per jar  ? ?Jar #1: Use as a moisturizer as needed. Can be applied to any area of the body. Use twice daily to unaffected areas. ? ?Jar #2: Pour one 69m bottle of clobetasol 0.05% solution into jar, mix well. Label this jar to indicate the medication has been added. Use twice daily to affected areas. Do not apply to face, groin or underarms. ? ?Moisturizer may burn or sting initially. Try for at least 4 weeks.  ? ? ? ?Gentle Skin Care Guide ? ?1. Bathe no more than once a day. ? ?2. Avoid bathing in hot water ? ?3. Use a mild soap like Dove, Vanicream, Cetaphil, CeraVe. Can use Lever 2000 or Cetaphil antibacterial soap ? ?4. Use soap only where you need it. On most days, use it under your arms, between your legs, and on your feet. Let the water rinse other areas unless visibly dirty. ? ?5. When you get out of the bath/shower, use a towel to gently blot your skin dry, don't rub it. ? ?6. While your skin is still a little damp, apply a moisturizing cream such as Vanicream, CeraVe, Cetaphil, Eucerin, Sarna lotion or plain Vaseline Jelly. For hands apply Neutrogena [place]Hand Cream or Excipial Hand Cream. ? ?7. Reapply moisturizer any time you start to itch or feel dry. ? ?8. Sometimes using free and clear laundry detergents can be helpful. Fabric softener sheets should be avoided. Downy Free & Gentle liquid, or any liquid fabric softener that is free of dyes and perfumes, it acceptable to use ? ?9. If your doctor has given you prescription creams you may apply moisturizers over them  ? ? ? ? ? ? ? ? ? ? ?Melanoma ABCDEs ? ?Melanoma is the most dangerous type of skin cancer, and is the leading cause of death from skin disease.  You are more likely to develop melanoma if you: ?Have light-colored skin, light-colored eyes, or red or blond hair ?Spend a lot  of time in the sun ?Tan regularly, either outdoors or in a tanning bed ?Have had blistering sunburns, especially during childhood ?Have a close family member who has had a melanoma ?Have atypical moles or large birthmarks ? ?Early detection of melanoma is key since treatment is typically straightforward and cure rates are extremely high if we catch it early.  ? ?The first sign of melanoma is often a change in a mole or a new dark spot.  The ABCDE system is a way of remembering the signs of melanoma. ? ?A for asymmetry:  The two halves do not match. ?B for border:  The edges of the growth are irregular. ?C for color:  A mixture of colors are present instead of an even brown color. ?D for diameter:  Melanomas are usually (but not always) greater than 625m- the size of a pencil eraser. ?E for evolution:  The spot keeps changing in size, shape, and color. ? ?Please check your skin once per month between visits. You can use a small mirror in front and a large mirror behind you to keep an eye on the back side or your body.  ? ?If you see any new or changing lesions before your next follow-up, please call to schedule a visit. ? ?Please continue daily skin protection including broad spectrum sunscreen SPF 30+ to sun-exposed  areas, reapplying every 2 hours as needed when you're outdoors.  ? ?Staying in the shade or wearing long sleeves, sun glasses (UVA+UVB protection) and wide brim hats (4-inch brim around the entire circumference of the hat) are also recommended for sun protection.   ? ? ?If You Need Anything After Your Visit ? ?If you have any questions or concerns for your doctor, please call our main line at [phone] and press option 4 to reach your doctor's medical assistant. If no one answers, please leave a voicemail as directed and we will return your call as soon as possible. Messages left after 4 pm will be answered the following business day.  ? ?You may also send [place] a message via MyChart. We typically  respond to MyChart messages within 1-2 business days. ? ?For prescription refills, please ask your pharmacy to contact our office. Our fax number is [phone]. ? ?If you have an urgent issue when the clinic is closed that cannot wait until the next business day, you can page your doctor at the number below.   ? ?Please note that while we do our best to be available for urgent issues outside of office hours, we are not available 24/7.  ? ?If you have an urgent issue and are unable to reach [place], you may choose to seek medical care at your doctor's office, retail clinic, urgent care center, or emergency room. ? ?If you have a medical emergency, please immediately call 911 or go to the emergency department. ? ?Pager Numbers ? ?- Dr. Nehemiah Massed: [phone] ? ?- Dr. Laurence Ferrari: [phone] ? ?- Dr. Nicole Kindred: [phone] ? ?In the event of inclement weather, please call our main line at [phone] for an update on the status of any delays or closures. ? ?Dermatology Medication Tips: ?Please keep the boxes that topical medications come in in order to help keep track of the instructions about where and how to use these. Pharmacies typically print the medication instructions only on the boxes and not directly on the medication tubes.  ? ?If your medication is too expensive, please contact our office at [phone] option 4 or send [place] a message through [place].  ? ?We are unable to tell what your co-pay for medications will be in advance as this is different depending on your insurance coverage. However, we may be able to find a substitute medication at lower cost or fill out paperwork to get insurance to cover a needed medication.  ? ?If a prior authorization is required to get your medication covered by your insurance company, please allow [place] 1-2 business days to complete this process. ? ?Drug prices often vary depending on where the prescription is filled and some pharmacies may offer cheaper prices. ? ?The website  www.goodrx.com contains coupons for medications through different pharmacies. The prices here do not account for what the cost may be with help from insurance (it may be cheaper with your insurance), but the website can give you the price if you did not use any insurance.  ?- You can print the associated coupon and take it with your prescription to the pharmacy.  ?- You may also stop by our office during regular business hours and pick up a GoodRx coupon card.  ?- If you need your prescription sent electronically to a different pharmacy, notify our office through [place] or by phone at [phone] option 4. ? ? ? ? ?Si Usted Necesita Algo Despu?s de Su Visita ? ?Tambi?n puede enviarnos un mensaje a trav?s  de MyChart. Por lo general respondemos a los mensajes de MyChart en el transcurso de 1 a 2 d?as h?biles. ? ?Para renovar recetas, por favor pida a su farmacia que se ponga en contacto con nuestra oficina. Nuestro n?mero de fax es el [phone]. ? ?Si tiene un asunto urgente cuando la cl?nica est? cerrada y que no puede esperar hasta el siguiente d?a h?bil, puede llamar/localizar a su doctor(a) al n?mero que aparece a continuaci?n.  ? ?Por favor, tenga en cuenta que aunque hacemos todo lo posible para estar disponibles para asuntos urgentes fuera del horario de oficina, no estamos disponibles las 24 horas del d?a, los 7 d?as de la semana.  ? ?Si tiene un problema urgente y no puede comunicarse con nosotros, puede optar por buscar atenci?n m?dica  en el consultorio de su doctor(a), en una cl?nica privada, en un centro de atenci?n urgente o en una sala de emergencias. ? ?Si tiene Engineer, maintenance (IT) m?dica, por favor llame inmediatamente al 911 o vaya a la sala de emergencias. ? ?N?meros de b?per ? ?- Dr. Nehemiah Massed: [phone] ? ?- Dra. Moye: [phone] ? ?- Dra. Nicole Kindred: [phone] ? ?En caso de inclemencias del tiempo, por favor llame a nuestra l?nea principal al [phone] para una actualizaci?n  sobre el estado de cualquier retraso o cierre. ? ?Consejos para la medicaci?n en dermatolog?a: ?Por favor, guarde las cajas en las que vienen los medicamentos de uso t?pico para ayudarle a seguir las instrucciones sob

## 2022-10-07 ENCOUNTER — Encounter: Payer: Medicare HMO | Admitting: Dermatology

## 2022-10-28 ENCOUNTER — Ambulatory Visit (INDEPENDENT_AMBULATORY_CARE_PROVIDER_SITE_OTHER): Payer: Medicare HMO | Admitting: Dermatology

## 2022-10-28 ENCOUNTER — Encounter: Payer: Self-pay | Admitting: Dermatology

## 2022-10-28 VITALS — BP 100/65 | HR 61

## 2022-10-28 DIAGNOSIS — Z872 Personal history of diseases of the skin and subcutaneous tissue: Secondary | ICD-10-CM

## 2022-10-28 DIAGNOSIS — L820 Inflamed seborrheic keratosis: Secondary | ICD-10-CM | POA: Diagnosis not present

## 2022-10-28 DIAGNOSIS — D229 Melanocytic nevi, unspecified: Secondary | ICD-10-CM

## 2022-10-28 DIAGNOSIS — Z1283 Encounter for screening for malignant neoplasm of skin: Secondary | ICD-10-CM

## 2022-10-28 DIAGNOSIS — Z85828 Personal history of other malignant neoplasm of skin: Secondary | ICD-10-CM

## 2022-10-28 DIAGNOSIS — L570 Actinic keratosis: Secondary | ICD-10-CM

## 2022-10-28 DIAGNOSIS — L219 Seborrheic dermatitis, unspecified: Secondary | ICD-10-CM

## 2022-10-28 DIAGNOSIS — L814 Other melanin hyperpigmentation: Secondary | ICD-10-CM

## 2022-10-28 DIAGNOSIS — D763 Other histiocytosis syndromes: Secondary | ICD-10-CM | POA: Diagnosis not present

## 2022-10-28 DIAGNOSIS — L578 Other skin changes due to chronic exposure to nonionizing radiation: Secondary | ICD-10-CM

## 2022-10-28 DIAGNOSIS — L821 Other seborrheic keratosis: Secondary | ICD-10-CM

## 2022-10-28 DIAGNOSIS — W908XXA Exposure to other nonionizing radiation, initial encounter: Secondary | ICD-10-CM

## 2022-10-28 DIAGNOSIS — D1801 Hemangioma of skin and subcutaneous tissue: Secondary | ICD-10-CM

## 2022-10-28 DIAGNOSIS — D489 Neoplasm of uncertain behavior, unspecified: Secondary | ICD-10-CM

## 2022-10-28 DIAGNOSIS — D170 Benign lipomatous neoplasm of skin and subcutaneous tissue of head, face and neck: Secondary | ICD-10-CM

## 2022-10-28 NOTE — Patient Instructions (Addendum)
Actinic keratoses are precancerous spots that appear secondary to cumulative UV radiation exposure/sun exposure over time. They are chronic with expected duration over 1 year. A portion of actinic keratoses will progress to squamous cell carcinoma of the skin. It is not possible to reliably predict which spots will progress to skin cancer and so treatment is recommended to prevent development of skin cancer.  Recommend daily broad spectrum sunscreen SPF 30+ to sun-exposed areas, reapply every 2 hours as needed.  Recommend staying in the shade or wearing long sleeves, sun glasses (UVA+UVB protection) and wide brim hats (4-inch brim around the entire circumference of the hat). Call for new or changing lesions.    Cryotherapy Aftercare  Wash gently with soap and water everyday.   Apply Vaseline and Band-Aid daily until healed.      Biopsy Wound Care Instructions  Leave the original bandage on for 24 hours if possible.  If the bandage becomes soaked or soiled before that time, it is OK to remove it and examine the wound.  A small amount of post-operative bleeding is normal.  If excessive bleeding occurs, remove the bandage, place gauze over the site and apply continuous pressure (no peeking) over the area for 30 minutes. If this does not work, please call our clinic as soon as possible or page your doctor if it is after hours.   Once a day, cleanse the wound with soap and water. It is fine to shower. If a thick crust develops you may use a Q-tip dipped into dilute hydrogen peroxide (mix 1:1 with water) to dissolve it.  Hydrogen peroxide can slow the healing process, so use it only as needed.    After washing, apply petroleum jelly (Vaseline) or an antibiotic ointment if your doctor prescribed one for you, followed by a bandage.    For best healing, the wound should be covered with a layer of ointment at all times. If you are not able to keep the area covered with a bandage to hold the ointment in  place, this may mean re-applying the ointment several times a day.  Continue this wound care until the wound has healed and is no longer open.   Itching and mild discomfort is normal during the healing process. However, if you develop pain or severe itching, please call our office.   If you have any discomfort, you can take Tylenol (acetaminophen) or ibuprofen as directed on the bottle. (Please do not take these if you have an allergy to them or cannot take them for another reason).  Some redness, tenderness and white or yellow material in the wound is normal healing.  If the area becomes very sore and red, or develops a thick yellow-green material (pus), it may be infected; please notify us.    If you have stitches, return to clinic as directed to have the stitches removed. You will continue wound care for 2-3 days after the stitches are removed.   Wound healing continues for up to one year following surgery. It is not unusual to experience pain in the scar from time to time during the interval.  If the pain becomes severe or the scar thickens, you should notify the office.    A slight amount of redness in a scar is expected for the first six months.  After six months, the redness will fade and the scar will soften and fade.  The color difference becomes less noticeable with time.  If there are any problems, return for a post-op   surgery check at your earliest convenience.  To improve the appearance of the scar, you can use silicone scar gel, cream, or sheets (such as Mederma or Serica) every night for up to one year. These are available over the counter (without a prescription).  Please call our office at [phone] for any questions or concerns.     Melanoma ABCDEs  Melanoma is the most dangerous type of skin cancer, and is the leading cause of death from skin disease.  You are more likely to develop melanoma if you: Have light-colored skin, light-colored eyes, or red or blond  hair Spend a lot of time in the sun Tan regularly, either outdoors or in a tanning bed Have had blistering sunburns, especially during childhood Have a close family member who has had a melanoma Have atypical moles or large birthmarks  Early detection of melanoma is key since treatment is typically straightforward and cure rates are extremely high if we catch it early.   The first sign of melanoma is often a change in a mole or a new dark spot.  The ABCDE system is a way of remembering the signs of melanoma.  A for asymmetry:  The two halves do not match. B for border:  The edges of the growth are irregular. C for color:  A mixture of colors are present instead of an even brown color. D for diameter:  Melanomas are usually (but not always) greater than 6mm - the size of a pencil eraser. E for evolution:  The spot keeps changing in size, shape, and color.  Please check your skin once per month between visits. You can use a small mirror in front and a large mirror behind you to keep an eye on the back side or your body.   If you see any new or changing lesions before your next follow-up, please call to schedule a visit.  Please continue daily skin protection including broad spectrum sunscreen SPF 30+ to sun-exposed areas, reapplying every 2 hours as needed when you're outdoors.   Staying in the shade or wearing long sleeves, sun glasses (UVA+UVB protection) and wide brim hats (4-inch brim around the entire circumference of the hat) are also recommended for sun protection.    Due to recent changes in healthcare laws, you may see results of your pathology and/or laboratory studies on MyChart before the doctors have had a chance to review them. We understand that in some cases there may be results that are confusing or concerning to you. Please understand that not all results are received at the same time and often the doctors may need to interpret multiple results in order to provide you with  the best plan of care or course of treatment. Therefore, we ask that you please give us 2 business days to thoroughly review all your results before contacting the office for clarification. Should we see a critical lab result, you will be contacted sooner.   If You Need Anything After Your Visit  If you have any questions or concerns for your doctor, please call our main line at [phone] and press option 4 to reach your doctor's medical assistant. If no one answers, please leave a voicemail as directed and we will return your call as soon as possible. Messages left after 4 pm will be answered the following business day.   You may also send us a message via MyChart. We typically respond to MyChart messages within 1-2 business days.  For prescription refills, please ask your   pharmacy to contact our office. Our fax number is [phone].  If you have an urgent issue when the clinic is closed that cannot wait until the next business day, you can page your doctor at the number below.    Please note that while we do our best to be available for urgent issues outside of office hours, we are not available 24/7.   If you have an urgent issue and are unable to reach us, you may choose to seek medical care at your doctor's office, retail clinic, urgent care center, or emergency room.  If you have a medical emergency, please immediately call 911 or go to the emergency department.  Pager Numbers  - Dr. Kowalski: [phone]  - Dr. Moye: [phone]  - Dr. Stewart: [phone]  In the event of inclement weather, please call our main line at [phone] for an update on the status of any delays or closures.  Dermatology Medication Tips: Please keep the boxes that topical medications come in in order to help keep track of the instructions about where and how to use these. Pharmacies typically print the medication instructions only on the boxes and not directly on the medication tubes.   If  your medication is too expensive, please contact our office at [phone] option 4 or send us a message through MyChart.   We are unable to tell what your co-pay for medications will be in advance as this is different depending on your insurance coverage. However, we may be able to find a substitute medication at lower cost or fill out paperwork to get insurance to cover a needed medication.   If a prior authorization is required to get your medication covered by your insurance company, please allow us 1-2 business days to complete this process.  Drug prices often vary depending on where the prescription is filled and some pharmacies may offer cheaper prices.  The website www.goodrx.com contains coupons for medications through different pharmacies. The prices here do not account for what the cost may be with help from insurance (it may be cheaper with your insurance), but the website can give you the price if you did not use any insurance.  - You can print the associated coupon and take it with your prescription to the pharmacy.  - You may also stop by our office during regular business hours and pick up a GoodRx coupon card.  - If you need your prescription sent electronically to a different pharmacy, notify our office through [place] MyChart or by phone at [phone] option 4.     Si Usted Necesita Algo Despus de Su Visita  Tambin puede enviarnos un mensaje a travs de MyChart. Por lo general respondemos a los mensajes de MyChart en el transcurso de 1 a 2 das hbiles.  Para renovar recetas, por favor pida a su farmacia que se ponga en contacto con nuestra oficina. Nuestro nmero de fax es el [phone].  Si tiene un asunto urgente cuando la clnica est cerrada y que no puede esperar hasta el siguiente da hbil, puede llamar/localizar a su doctor(a) al nmero que aparece a continuacin.   Por favor, tenga en cuenta que aunque hacemos todo lo posible para estar disponibles para  asuntos urgentes fuera del horario de oficina, no estamos disponibles las 24 horas del da, los 7 das de la semana.   Si tiene un problema urgente y no puede comunicarse con nosotros, puede optar por buscar atencin mdica  en el consultorio de su doctor(a), en   una clnica privada, en un centro de atencin urgente o en una sala de emergencias.  Si tiene una emergencia mdica, por favor llame inmediatamente al 911 o vaya a la sala de emergencias.  Nmeros de bper  - Dr. Kowalski: [phone]  - Dra. Moye: [phone]  - Dra. Stewart: [phone]  En caso de inclemencias del tiempo, por favor llame a nuestra lnea principal al [phone] para una actualizacin sobre el estado de cualquier retraso o cierre.  Consejos para la medicacin en dermatologa: Por favor, guarde las cajas en las que vienen los medicamentos de uso tpico para ayudarle a seguir las instrucciones sobre dnde y cmo usarlos. Las farmacias generalmente imprimen las instrucciones del medicamento slo en las cajas y no directamente en los tubos del medicamento.   Si su medicamento es muy caro, por favor, pngase en contacto con nuestra oficina llamando al [phone] y presione la opcin 4 o envenos un mensaje a travs de MyChart.   No podemos decirle cul ser su copago por los medicamentos por adelantado ya que esto es diferente dependiendo de la cobertura de su seguro. Sin embargo, es posible que podamos encontrar un medicamento sustituto a menor costo o llenar un formulario para que el seguro cubra el medicamento que se considera necesario.   Si se requiere una autorizacin previa para que su compaa de seguros cubra su medicamento, por favor permtanos de 1 a 2 das hbiles para completar este proceso.  Los precios de los medicamentos varan con frecuencia dependiendo del lugar de dnde se surte la receta y alguna farmacias pueden ofrecer precios ms baratos.  El sitio web www.goodrx.com tiene cupones para  medicamentos de diferentes farmacias. Los precios aqu no tienen en cuenta lo que podra costar con la ayuda del seguro (puede ser ms barato con su seguro), pero el sitio web puede darle el precio si no utiliz ningn seguro.  - Puede imprimir el cupn correspondiente y llevarlo con su receta a la farmacia.  - Tambin puede pasar por nuestra oficina durante el horario de atencin regular y recoger una tarjeta de cupones de GoodRx.  - Si necesita que su receta se enve electrnicamente a una farmacia diferente, informe a nuestra oficina a travs de MyChart de [place] o por telfono llamando al [phone] y presione la opcin 4.  

## 2022-10-28 NOTE — Progress Notes (Signed)
Follow-Up Visit   Subjective  Luis Gibson. is a 77 y.o. male who presents for the following: Skin Cancer Screening and Full Body Skin Exam Hx of aks, hx of bcc, hx of rosacea Scaly itchy spots at scalp and itchy ears. Given rx of mometasone lotion to use on ears by ENT which helps.   The patient presents for Total-Body Skin Exam (TBSE) for skin cancer screening and mole check. The patient has spots, moles and lesions to be evaluated, some may be new or changing and the patient has concerns that these could be cancer.    The following portions of the chart were reviewed this encounter and updated as appropriate: medications, allergies, medical history  Review of Systems:  No other skin or systemic complaints except as noted in HPI or Assessment and Plan.  Objective  Well appearing patient in no apparent distress; mood and affect are within normal limits.  A full examination was performed including scalp, head, eyes, ears, nose, lips, neck, chest, axillae, abdomen, back, buttocks, bilateral upper extremities, bilateral lower extremities, hands, feet, fingers, toes, fingernails, and toenails. All findings within normal limits unless otherwise noted below.   Relevant physical exam findings are noted in the Assessment and Plan.  vertex scalp x 3 , forehead x 2 (5) Erythematous thin papules/macules with gritty scale.   left spinal lower back 7 mm pink orange smooth papule          right lower abdomen Erythematous stuck-on, waxy plaque    Assessment & Plan   LENTIGINES, SEBORRHEIC KERATOSES, HEMANGIOMAS - Benign normal skin lesions - Benign-appearing - Call for any changes  SEBORRHEIC DERMATITIS Exam: erythema/scale at b/l ears  Chronic and persistent condition with duration or expected duration over one year. Condition is symptomatic/ bothersome to patient. Improving but not currently at goal.  Seborrheic Dermatitis is a chronic persistent rash characterized by  pinkness and scaling most commonly of the mid face but also can occur on the scalp (dandruff), ears; mid chest, mid back and groin.  It tends to be exacerbated by stress and cooler weather.  People who have neurologic disease may experience new onset or exacerbation of existing seborrheic dermatitis.  The condition is not curable but treatable and can be controlled.  Treatment Plan: Continue mometasone lotion to ears as prescribed qd/bid prn for itch  MELANOCYTIC NEVI - Tan-brown and/or pink-flesh-colored symmetric macules and papules - Benign appearing on exam today - Observation - Call clinic for new or changing moles - Recommend daily use of broad spectrum spf 30+ sunscreen to sun-exposed areas.   Nevus Vs SK  left forearm  2.78mm med dark brown macule, no changes per pt    Benign-appearing.  Observation.  Call clinic for new or changing lesions.  Recommend daily use of broad spectrum spf 30+ sunscreen to sun-exposed areas.    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 Observe   Venous lake posterior neck at hairline   5 mm blanching blue macule   Benign, observe.   Lipoma   right forehead above eyebrow 2.5  cm firm rubbery SQ nodule  Benign, Stable, Asymptomatic, Observe.   ACTINIC DAMAGE - Chronic condition, secondary to cumulative UV/sun exposure - diffuse scaly erythematous macules with underlying dyspigmentation - Recommend daily broad spectrum sunscreen SPF 30+ to sun-exposed areas, reapply every 2 hours as needed.  - Staying in the shade or wearing long sleeves, sun  glasses (UVA+UVB protection) and wide brim hats (4-inch brim around the entire circumference of the hat) are also recommended for sun protection.  - Call for new or changing lesions.  HISTORY OF BASAL CELL CARCINOMA OF THE SKIN Right lower medial calf  - No evidence of recurrence today - Recommend regular full body skin exams -  Recommend daily broad spectrum sunscreen SPF 30+ to sun-exposed areas, reapply every 2 hours as needed.  - Call if any new or changing lesions are noted between office visits  SKIN CANCER SCREENING PERFORMED TODAY.  Actinic keratosis (5) vertex scalp x 3 , forehead x 2  Vs isk at vertex scalp   Actinic keratoses are precancerous spots that appear secondary to cumulative UV radiation exposure/sun exposure over time. They are chronic with expected duration over 1 year. A portion of actinic keratoses will progress to squamous cell carcinoma of the skin. It is not possible to reliably predict which spots will progress to skin cancer and so treatment is recommended to prevent development of skin cancer.  Recommend daily broad spectrum sunscreen SPF 30+ to sun-exposed areas, reapply every 2 hours as needed.  Recommend staying in the shade or wearing long sleeves, sun glasses (UVA+UVB protection) and wide brim hats (4-inch brim around the entire circumference of the hat). Call for new or changing lesions.  Destruction of lesion - vertex scalp x 3 , forehead x 2  Destruction method: cryotherapy   Informed consent: discussed and consent obtained   Lesion destroyed using liquid nitrogen: Yes   Region frozen until ice ball extended beyond lesion: Yes   Outcome: patient tolerated procedure well with no complications   Post-procedure details: wound care instructions given   Additional details:  Prior to procedure, discussed risks of blister formation, small wound, skin dyspigmentation, or rare scar following cryotherapy. Recommend Vaseline ointment to treated areas while healing.   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 Anesthesia: the lesion was anesthetized in a standard fashion    Anesthetic:  1% lidocaine w/ epinephrine 1-100,000 buffered w/ 8.4% NaHCO3 Instrument used: flexible razor blade   Hemostasis achieved with: pressure, aluminum chloride and electrodesiccation   Outcome: patient tolerated procedure well   Post-procedure details: wound care instructions given   Post-procedure details comment:  Ointment and small bandage applied.   Specimen 1 - Surgical pathology Differential Diagnosis: Xanthoma r/o bcc  Check Margins: No  Xanthoma r/o bcc  Inflamed seborrheic keratosis right lower abdomen  Previous treated with LN2, not bothersome  Benign, observe.     Return in about 1 year (around 10/28/2023) for TBSE.  I, Asher Muir, CMA, am acting as scribe for Willeen Niece, MD.   Documentation: I have reviewed the above documentation for accuracy and completeness, and I agree with the above.  Willeen Niece, MD

## 2022-11-04 ENCOUNTER — Telehealth: Payer: Self-pay

## 2022-11-04 NOTE — Telephone Encounter (Signed)
-----   Message from Willeen Niece, MD sent at 11/04/2022  5:40 PM EDT ----- Skin , left spinal lower back XANTHOGRANULOMA, ADULT VARIANT, BASE INVOLVED  Xanthogranuloma is a benign, histiocytic (type of immune cell) skin lesion. More commonly seen in children but can rarely occur in adults.  No further treatment needed- will observe area - please call patient

## 2022-11-04 NOTE — Telephone Encounter (Signed)
Advised patient biopsy was benign and no further treatment needed. Will recheck at follow-up appt.

## 2023-11-10 ENCOUNTER — Encounter: Payer: Medicare HMO | Admitting: Dermatology

## 2023-11-11 ENCOUNTER — Ambulatory Visit: Admitting: Dermatology

## 2023-11-11 DIAGNOSIS — D763 Other histiocytosis syndromes: Secondary | ICD-10-CM

## 2023-11-11 DIAGNOSIS — L853 Xerosis cutis: Secondary | ICD-10-CM

## 2023-11-11 DIAGNOSIS — L814 Other melanin hyperpigmentation: Secondary | ICD-10-CM

## 2023-11-11 DIAGNOSIS — Z85828 Personal history of other malignant neoplasm of skin: Secondary | ICD-10-CM

## 2023-11-11 DIAGNOSIS — Z1283 Encounter for screening for malignant neoplasm of skin: Secondary | ICD-10-CM

## 2023-11-11 DIAGNOSIS — L570 Actinic keratosis: Secondary | ICD-10-CM | POA: Diagnosis not present

## 2023-11-11 DIAGNOSIS — L821 Other seborrheic keratosis: Secondary | ICD-10-CM

## 2023-11-11 DIAGNOSIS — L820 Inflamed seborrheic keratosis: Secondary | ICD-10-CM

## 2023-11-11 DIAGNOSIS — D1801 Hemangioma of skin and subcutaneous tissue: Secondary | ICD-10-CM

## 2023-11-11 DIAGNOSIS — W908XXA Exposure to other nonionizing radiation, initial encounter: Secondary | ICD-10-CM

## 2023-11-11 DIAGNOSIS — R238 Other skin changes: Secondary | ICD-10-CM

## 2023-11-11 DIAGNOSIS — L578 Other skin changes due to chronic exposure to nonionizing radiation: Secondary | ICD-10-CM | POA: Diagnosis not present

## 2023-11-11 DIAGNOSIS — L219 Seborrheic dermatitis, unspecified: Secondary | ICD-10-CM | POA: Diagnosis not present

## 2023-11-11 DIAGNOSIS — D229 Melanocytic nevi, unspecified: Secondary | ICD-10-CM

## 2023-11-11 DIAGNOSIS — Q828 Other specified congenital malformations of skin: Secondary | ICD-10-CM

## 2023-11-11 DIAGNOSIS — D170 Benign lipomatous neoplasm of skin and subcutaneous tissue of head, face and neck: Secondary | ICD-10-CM

## 2023-11-11 NOTE — Progress Notes (Signed)
 Follow-Up Visit   Subjective  Luis Gibson. is a 78 y.o. male who presents for the following: Skin Cancer Screening and Upper Body Skin Exam. Patient with history of BCC, AKS, seb derm. 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   The patient presents for Upper Body Skin Exam (UBSE) for skin cancer screening and mole check. The patient has spots, moles and lesions to be evaluated, some may be new or changing and the patient may have concern these could be cancer.   The following portions of the chart were reviewed this encounter and updated as appropriate: medications, allergies, medical history  Review of Systems:  No other skin or systemic complaints except as noted in HPI or Assessment and Plan.  Objective  Well appearing patient in no apparent distress; mood and affect are within normal limits.  All skin waist up examined. Relevant physical exam findings are noted in the Assessment and Plan.  R mid ear helix x1, L forehead x1, L ear helix x1 (3) Pink scaly macules Bilateral thenar hands Waxy pink maculeswith adjacent hypopigmentation (L x 2, R x 1), pt states was frozen in past, not bothersome at this time   Assessment & Plan   AK (ACTINIC KERATOSIS) (3) R mid ear helix x1, L forehead x1, L ear helix x1 (3) Actinic keratoses are precancerous spots that appear secondary to cumulative UV radiation exposure/sun exposure over time. They are chronic with expected duration over 1 year. A portion of actinic keratoses will progress to squamous cell carcinoma of the skin. It is not possible to reliably predict which spots will progress to skin cancer and so treatment is recommended to prevent development of skin cancer.  Recommend daily broad spectrum sunscreen SPF 30+ to sun-exposed areas, reapply every 2 hours as needed.  Recommend staying in the shade or wearing long sleeves, sun glasses (UVA+UVB protection) and wide brim hats (4-inch brim around the entire  circumference of the hat). Call for new or changing lesions. Destruction of lesion - R mid ear helix x1, L forehead x1, L ear helix x1 (3)  Destruction method: cryotherapy   Informed consent: discussed and consent obtained   Lesion destroyed using liquid nitrogen: Yes   Region frozen until ice ball extended beyond lesion: Yes   Outcome: patient tolerated procedure well with no complications   Post-procedure details: wound care instructions given   Additional details:  Prior to procedure, discussed risks of blister formation, small wound, skin dyspigmentation, or rare scar following cryotherapy. Recommend Vaseline ointment to treated areas while healing.  INFLAMED SEBORRHEIC KERATOSIS Bilateral thenar hands Recurrent  Benign-appearing.  Observation.  Call clinic for new or changing lesions.  Recommend daily use of broad spectrum spf 30+ sunscreen to sun-exposed areas.    patient defers cryotherapy today- he will advise if they grow or change and be seen sooner.   Skin cancer screening performed today.  Actinic Damage - Chronic condition, secondary to cumulative UV/sun exposure - diffuse scaly erythematous macules with underlying dyspigmentation - Recommend daily broad spectrum sunscreen SPF 30+ to sun-exposed areas, reapply every 2 hours as needed.  - Staying in the shade or wearing long sleeves, sun glasses (UVA+UVB protection) and wide brim hats (4-inch brim around the entire circumference of the hat) are also recommended for sun protection.  - Call for new or changing lesions.  Lentigines, Seborrheic Keratoses, Hemangiomas - Benign normal skin lesions - Benign-appearing - Call for any changes  Melanocytic Nevi - Tan-brown and/or pink-flesh-colored  symmetric macules and papules - 9mm fleshy papule at mid chest - 2.6mm med dark brown macule at left forearm, same on exam today. (Vs SK) - Benign appearing on exam today - Observation - Call clinic for new or changing moles -  Recommend daily use of broad spectrum spf 30+ sunscreen to sun-exposed areas.   HISTORY OF BASAL CELL CARCINOMA OF THE SKIN R lower med calf- 12/14/2018 - No evidence of recurrence today - Recommend regular full body skin exams - Recommend daily broad spectrum sunscreen SPF 30+ to sun-exposed areas, reapply every 2 hours as needed.  - Call if any new or changing lesions are noted between office visits  Lipoma  Exam: 2.5 cm firm rubbery SQ nodule  Location: right forehead above eyebrow  Benign-appearing. Exam most consistent with a lipoma. Discussed that a lipoma is a benign fatty growth that can grow over time and sometimes get irritated. Recommend observation if it is not bothersome or changing. Discussed option of ILK injections or surgical excision to remove it if it is growing, symptomatic, or other changes noted. Please call for new or changing lesions so they can be evaluated.  Recommend Dr. Paci if patient would like to have removed, patient will let us  know.   [place] Exam: 5 mm blanching blue macule at posterior neck at hairline  Treatment Plan: Benign. Observe    Porokeratosis vs SK  Exam: 2.3 cm waxy tan macule with keratotic rim and darker edge at right extensor forearm    Treatment Plan: Benign. Observe   HISTORY OF biopsy proved Xanthogranuloma.  - No evidence of recurrence today left spinal lower back  - Recommend regular full body skin exams - Recommend daily broad spectrum sunscreen SPF 30+ to sun-exposed areas, reapply every 2 hours as needed.  - Call if any new or changing lesions are noted between office visits   Xerosis with pruritus vs ISKs - diffuse xerotic patches back with multiple waxy tan papules - pt defers cryotherapy - recommend gentle, hydrating skin care - recommend OTC CeraVe anti itch cream every day/bid - gentle skin care handout given   SEBORRHEIC DERMATITIS Exam: Ears clear today   Chronic condition with duration or expected  duration over one year. Currently well-controlled.    Seborrheic Dermatitis is a chronic persistent rash characterized by pinkness and scaling most commonly of the mid face but also can occur on the scalp (dandruff), ears; mid chest, mid back and groin.  It tends to be exacerbated by stress and cooler weather.  People who have neurologic disease may experience new onset or exacerbation of existing seborrheic dermatitis.  The condition is not curable but treatable and can be controlled.   Treatment Plan: Continue mometasone lotion to ears qd/bid prn for itch/flares    Return in 1 year (on 11/10/2024) for w/ Dr. Annette Barters, Hx BCC, UBSE.  I, Hector Littles, CMA, am acting as scribe for Artemio Larry, MD .   Documentation: I have reviewed the above documentation for accuracy and completeness, and I agree with the above.  Artemio Larry, MD

## 2023-11-11 NOTE — Patient Instructions (Addendum)

## 2024-11-15 ENCOUNTER — Ambulatory Visit: Admitting: Dermatology
